# Patient Record
Sex: Male | Born: 1960 | Race: White | Hispanic: No | Marital: Married | Smoking: Never smoker
Health system: Southern US, Community
[De-identification: ages and names within clinical notes are randomized; demographics above are authoritative.]

## PROBLEM LIST (undated history)

## (undated) DIAGNOSIS — N200 Calculus of kidney: Secondary | ICD-10-CM

## (undated) HISTORY — PX: LITHOTRIPSY: SUR834

## (undated) HISTORY — PX: TONSILLECTOMY: SUR1361

---

## 2012-02-12 ENCOUNTER — Emergency Department (HOSPITAL_BASED_OUTPATIENT_CLINIC_OR_DEPARTMENT_OTHER)
Admission: EM | Admit: 2012-02-12 | Discharge: 2012-02-12 | Disposition: A | Discharge status: Home / Self Care | Payer: BC Managed Care – PPO | Attending: Emergency Medicine | Admitting: Emergency Medicine

## 2012-02-12 ENCOUNTER — Encounter (HOSPITAL_BASED_OUTPATIENT_CLINIC_OR_DEPARTMENT_OTHER): Payer: Self-pay | Admitting: *Deleted

## 2012-02-12 ENCOUNTER — Emergency Department (HOSPITAL_BASED_OUTPATIENT_CLINIC_OR_DEPARTMENT_OTHER): Payer: BC Managed Care – PPO

## 2012-02-12 DIAGNOSIS — Z87442 Personal history of urinary calculi: Secondary | ICD-10-CM | POA: Insufficient documentation

## 2012-02-12 DIAGNOSIS — N23 Unspecified renal colic: Secondary | ICD-10-CM

## 2012-02-12 HISTORY — DX: Calculus of kidney: N20.0

## 2012-02-12 LAB — URINE MICROSCOPIC-ADD ON

## 2012-02-12 LAB — URINALYSIS, ROUTINE W REFLEX MICROSCOPIC
Bilirubin Urine: NEGATIVE
Specific Gravity, Urine: 1.031 — ABNORMAL HIGH (ref 1.005–1.030)
Urobilinogen, UA: 0.2 mg/dL (ref 0.0–1.0)

## 2012-02-12 MED ORDER — ONDANSETRON HCL 4 MG/2ML IJ SOLN
4.0000 mg | Freq: Once | INTRAMUSCULAR | Status: AC
  Administered 2012-02-12: 4 mg via INTRAVENOUS
  Filled 2012-02-12: qty 2

## 2012-02-12 MED ORDER — SODIUM CHLORIDE 0.9 % IV BOLUS (SEPSIS)
500.0000 mL | Freq: Once | INTRAVENOUS | Status: AC
  Administered 2012-02-12: 1000 mL via INTRAVENOUS

## 2012-02-12 MED ORDER — OXYCODONE-ACETAMINOPHEN 5-325 MG PO TABS: 1.0000 | ORAL_TABLET | ORAL | Status: DC | PRN

## 2012-02-12 MED ORDER — ONDANSETRON 8 MG PO TBDP: 8.0000 mg | ORAL_TABLET | Freq: Three times a day (TID) | ORAL | Status: AC | PRN

## 2012-02-12 MED ORDER — HYDROMORPHONE HCL PF 1 MG/ML IJ SOLN
1.0000 mg | Freq: Once | INTRAMUSCULAR | Status: AC
  Administered 2012-02-12: 1 mg via INTRAVENOUS
  Filled 2012-02-12: qty 1

## 2012-02-12 MED ORDER — KETOROLAC TROMETHAMINE 30 MG/ML IJ SOLN
30.0000 mg | Freq: Once | INTRAMUSCULAR | Status: AC
  Administered 2012-02-12: 30 mg via INTRAVENOUS
  Filled 2012-02-12: qty 1

## 2012-02-12 NOTE — ED Notes (Signed)
Patient C/o L side pain, hx kidney stones, pain since 530am. Took oxycodone at 700 & 740

## 2012-02-12 NOTE — ED Provider Notes (Signed)
History     CSN: [id number]  Arrival date & time 02/12/12  0844   First MD Initiated Contact with Patient 02/12/12 [phone]      Chief Complaint  Patient presents with  . Flank Pain    (Consider location/radiation/quality/duration/timing/severity/associated sxs/prior treatment) HPI Comments: Symptoms cw prior kidney stones.  Patient passed two prior stones and sees urologist in [facility]. He has had difficulty voiding.  No vomiting, fever, chills.   Patient is a 52 y.o. male presenting with flank pain. The history is provided by the patient.  Flank Pain This is a new problem. The current episode started 1 to 2 hours ago. The problem occurs constantly. The problem has been gradually worsening. Pertinent negatives include no chest pain, no abdominal pain, no headaches and no shortness of breath. Nothing aggravates the symptoms. Nothing relieves the symptoms. Treatments tried: percocet without relief. The treatment provided no relief.    Past Medical History  Diagnosis Date  . Kidney stones     Past Surgical History  Procedure Date  . Tonsillectomy     No family history on file.  History  Substance Use Topics  . Smoking status: Never Smoker   . Smokeless tobacco: Not on file  . Alcohol Use: Yes      Review of Systems  Respiratory: Negative for shortness of breath.   Cardiovascular: Negative for chest pain.  Gastrointestinal: Negative for abdominal pain.  Genitourinary: Positive for flank pain.  Neurological: Negative for headaches.  All other systems reviewed and are negative.    Allergies  Sulfa antibiotics  Home Medications   Current Outpatient Rx  Name  Route  Sig  Dispense  Refill  . ONDANSETRON 8 MG PO TBDP   Oral   Take 1 tablet (8 mg total) by mouth every 8 (eight) hours as needed for nausea.   20 tablet   0   . OXYCODONE-ACETAMINOPHEN 5-325 MG PO TABS   Oral   Take 1 tablet by mouth every 4 (four) hours as needed for pain.   15 tablet   0      BP 129/73  Pulse 60  Resp 19  SpO2 100%  Physical Exam  Nursing note and vitals reviewed. Constitutional: He is oriented to person, place, and time. He appears well-developed and well-nourished.       Pacing and appears uncomfortable  HENT:  Head: Normocephalic and atraumatic.  Right Ear: External ear normal.  Left Ear: External ear normal.  Nose: Nose normal.  Mouth/Throat: Oropharynx is clear and moist.  Eyes: Conjunctivae normal and EOM are normal. Pupils are equal, round, and reactive to light.  Neck: Normal range of motion. Neck supple.  Cardiovascular: Normal rate, regular rhythm, normal heart sounds and intact distal pulses.   Pulmonary/Chest: Effort normal and breath sounds normal. No respiratory distress. He has no wheezes. He exhibits no tenderness.  Abdominal: Soft. Bowel sounds are normal. He exhibits no distension and no mass. There is no tenderness. There is no rebound and no guarding.  Musculoskeletal: Normal range of motion.  Neurological: He is alert and oriented to person, place, and time. He has normal reflexes. He exhibits normal muscle tone. Coordination normal.  Skin: Skin is warm and dry.  Psychiatric: He has a normal mood and affect. His behavior is normal. Judgment and thought content normal.    ED Course  Procedures (including critical care time)  Labs Reviewed  URINALYSIS, ROUTINE W REFLEX MICROSCOPIC - Abnormal; Notable for the following:  Color, Urine AMBER (*)  BIOCHEMICALS MAY BE AFFECTED BY COLOR   Specific Gravity, Urine 1.031 (*)     Hgb urine dipstick MODERATE (*)     Ketones, ur 15 (*)     All other components within normal limits  URINE MICROSCOPIC-ADD ON   Ct Abdomen Pelvis Wo Contrast  02/12/2012  *RADIOLOGY REPORT*  Clinical Data: Acute onset of left flank pain earlier this morning. History of urinary tract calculi.  CT ABDOMEN AND PELVIS WITHOUT CONTRAST  Technique:  Multidetector CT imaging of the abdomen and pelvis was  performed following the standard protocol without intravenous contrast.  Comparison: None.  Findings: Approximate 3 mm calculus at the left ureterovesical junction causing moderate left hydronephrosis, left renal edema, and left perinephric edema.  Tiny (1-2 mm) calculi in adjacent upper pole calyces of the left kidney.  Non-obstructing approximate 4 mm calculus in a mid calix of the right kidney.  Within the limits of the unenhanced technique, no focal parenchymal abnormality involving either kidney.  Normal unenhanced appearance of the liver, spleen, pancreas, adrenal glands, and gallbladder.  Accessory splenic tissue medial to the spleen anteriorly at the hilum.  No biliary ductal dilation. Mild iliofemoral atherosclerosis.  No significant lymphadenopathy.  Small hiatal hernia.  Stomach otherwise normal in appearance. Inspissated stool-like material over a long segment of the distal and terminal ileum.  Normal-appearing colon.  Normal appendix in the right mid pelvis.  No ascites.  Urinary bladder decompressed.  Prostate gland and seminal vesicles normal for age.  Phlebolith low in the right side of the pelvis.  Bone window images demonstrate degenerative disc disease and spondylosis at L5-S1.  Visualized lung bases clear.  Heart size normal.  IMPRESSION:  1.  Obstructing approximate 3 mm calculus at the left UVJ. 2.  Non-obstructing tiny (1-2 mm) calculi in upper pole calyces of the left kidney and approximate 4 mm calculus in a mid calix of the right kidney. 3.  Small hiatal hernia. 4.  Inspissated stool-like material over a long segment of the distal and terminal ileum consistent with stasis. 5.  Mild iliofemoral atherosclerosis, somewhat advanced for age.   Original Report Authenticated By: Hulan Saas, M.D.      1. Ureteral colic       MDM  Pain improved with meds and feels better.        Hilario Quarry, MD 02/12/12 1009

## 2012-03-24 ENCOUNTER — Encounter (HOSPITAL_BASED_OUTPATIENT_CLINIC_OR_DEPARTMENT_OTHER): Payer: Self-pay | Admitting: *Deleted

## 2012-03-24 ENCOUNTER — Emergency Department (HOSPITAL_BASED_OUTPATIENT_CLINIC_OR_DEPARTMENT_OTHER)
Admission: EM | Admit: 2012-03-24 | Discharge: 2012-03-24 | Disposition: A | Discharge status: Home / Self Care | Payer: BC Managed Care – PPO | Attending: Emergency Medicine | Admitting: Emergency Medicine

## 2012-03-24 DIAGNOSIS — R112 Nausea with vomiting, unspecified: Secondary | ICD-10-CM | POA: Insufficient documentation

## 2012-03-24 DIAGNOSIS — Z87448 Personal history of other diseases of urinary system: Secondary | ICD-10-CM | POA: Insufficient documentation

## 2012-03-24 DIAGNOSIS — N23 Unspecified renal colic: Secondary | ICD-10-CM

## 2012-03-24 DIAGNOSIS — Z87442 Personal history of urinary calculi: Secondary | ICD-10-CM | POA: Insufficient documentation

## 2012-03-24 DIAGNOSIS — N201 Calculus of ureter: Secondary | ICD-10-CM | POA: Insufficient documentation

## 2012-03-24 DIAGNOSIS — Z79899 Other long term (current) drug therapy: Secondary | ICD-10-CM | POA: Insufficient documentation

## 2012-03-24 MED ORDER — KETOROLAC TROMETHAMINE 15 MG/ML IJ SOLN
INTRAMUSCULAR | Status: AC
  Administered 2012-03-24: 15 mg via INTRAVENOUS
  Filled 2012-03-24: qty 1

## 2012-03-24 MED ORDER — HYDROMORPHONE HCL 4 MG PO TABS: 2.0000 mg | ORAL_TABLET | ORAL | Status: AC | PRN

## 2012-03-24 MED ORDER — METOCLOPRAMIDE HCL 5 MG/ML IJ SOLN
INTRAMUSCULAR | Status: AC
  Administered 2012-03-24: 10 mg via INTRAMUSCULAR
  Filled 2012-03-24: qty 2

## 2012-03-24 MED ORDER — SODIUM CHLORIDE 0.9 % IV SOLN
INTRAVENOUS | Status: DC
  Administered 2012-03-24 (×2): via INTRAVENOUS

## 2012-03-24 MED ORDER — PROMETHAZINE HCL 25 MG PO TABS: 25.0000 mg | ORAL_TABLET | Freq: Four times a day (QID) | ORAL | Status: AC | PRN

## 2012-03-24 MED ORDER — HYDROMORPHONE HCL PF 1 MG/ML IJ SOLN
1.0000 mg | Freq: Once | INTRAMUSCULAR | Status: AC
  Administered 2012-03-24: 1 mg via INTRAVENOUS

## 2012-03-24 MED ORDER — TAMSULOSIN HCL 0.4 MG PO CAPS: 0.4000 mg | ORAL_CAPSULE | Freq: Once | ORAL | Status: DC

## 2012-03-24 MED ORDER — METOCLOPRAMIDE HCL 5 MG/ML IJ SOLN
10.0000 mg | Freq: Once | INTRAMUSCULAR | Status: AC
  Administered 2012-03-24: 10 mg via INTRAMUSCULAR

## 2012-03-24 MED ORDER — HYDROMORPHONE HCL PF 1 MG/ML IJ SOLN
INTRAMUSCULAR | Status: AC
  Administered 2012-03-24: 1 mg via INTRAVENOUS
  Filled 2012-03-24: qty 1

## 2012-03-24 MED ORDER — KETOROLAC TROMETHAMINE 15 MG/ML IJ SOLN
15.0000 mg | Freq: Once | INTRAMUSCULAR | Status: AC
  Administered 2012-03-24: 15 mg via INTRAVENOUS

## 2012-03-24 MED ORDER — HYDROMORPHONE HCL PF 1 MG/ML IJ SOLN
1.0000 mg | Freq: Once | INTRAMUSCULAR | Status: AC
  Administered 2012-03-24: 1 mg via INTRAVENOUS
  Filled 2012-03-24: qty 1

## 2012-03-24 NOTE — ED Notes (Signed)
Pt wife requesting the patient have ice chips on arrival to triage room, told her we would have to hold off until we speak with EDP. EDP at bedside, okay for patient to have ice chips. Gave patient ice chips

## 2012-03-24 NOTE — ED Notes (Signed)
MD at bedside to speak with patient and family.  Pt resting comfortably.

## 2012-03-24 NOTE — ED Provider Notes (Signed)
History     CSN: [id number]  Arrival date & time 03/24/12  0011   First MD Initiated Contact with Patient 03/24/12 0021      Chief Complaint  Patient presents with  . Flank Pain    (Consider location/radiation/quality/duration/timing/severity/associated sxs/prior treatment) HPI Level V caveat: Severe pain. This is a 52 year old male who had lithotripsy yesterday morning for known 4 mm stone. He has been passing gravel throughout the day yesterday. Since about 9 PM yesterday evening he has been unable to urinate and has had severe pain in his right flank radiating to his right groin. The pain is only slightly worsened with movement or palpation. There is associated nausea and vomiting. The pain is characterized as like prior kidney stones. He had been having gross hematuria throughout most of the day but this is resolved.  Past Medical History  Diagnosis Date  . Kidney stones   . Kidney stones     Past Surgical History  Procedure Laterality Date  . Tonsillectomy    . Lithotripsy      No family history on file.  History  Substance Use Topics  . Smoking status: Never Smoker   . Smokeless tobacco: Not on file  . Alcohol Use: No      Review of Systems  Unable to perform ROS   Allergies  Sulfa antibiotics  Home Medications   Current Outpatient Rx  Name  Route  Sig  Dispense  Refill  . omeprazole (PRILOSEC) 40 MG capsule   Oral   Take 40 mg by mouth daily.         . ondansetron (ZOFRAN ODT) 8 MG disintegrating tablet   Oral   Take 1 tablet (8 mg total) by mouth every 8 (eight) hours as needed for nausea.   20 tablet   0   . oxyCODONE-acetaminophen (PERCOCET/ROXICET) 5-325 MG per tablet   Oral   Take 1 tablet by mouth every 4 (four) hours as needed for pain.   15 tablet   0     BP 124/76  Pulse 60  Temp(Src) 97.6 F (36.4 C) (Oral)  Resp 20  Ht 5\' 10"  (1.778 m)  Wt 220 lb (99.791 kg)  BMI 31.57 kg/m2  SpO2 100%  Physical Exam General:  Well-developed, well-nourished male in obvious pain; appearance consistent with age of record HENT: normocephalic, atraumatic; dry mucous membranes Eyes: pupils equal round and reactive to light; extraocular muscles intact Neck: supple Heart: regular rate and rhythm Lungs: clear to auscultation bilaterally Abdomen: soft; nondistended; nontender; bowel sounds present GU: Mild right CVA tenderness; urine clear, yellow Extremities: No deformity; full range of motion Neurologic: Awake, alert and oriented; motor function intact in all extremities and symmetric; no facial droop Skin: Mildly clammy Psychiatric: Agitated; anxious    ED Course  Procedures (including critical care time)    MDM  1:47 AM Pain well controlled after IV medications. We will switch from oxycodone to Dilaudid tablets.        Hanley Seamen, MD 03/24/12 [phone]

## 2012-03-24 NOTE — ED Notes (Signed)
Pt reports he had a lithotripsy completed today and has been passing stones today. C/o right abdominal pain and flank pain and feeling like he cannot urinate. ([facility] completed the procedure)

## 2020-10-14 ENCOUNTER — Encounter (HOSPITAL_BASED_OUTPATIENT_CLINIC_OR_DEPARTMENT_OTHER): Payer: Self-pay | Admitting: *Deleted

## 2020-10-14 ENCOUNTER — Emergency Department (HOSPITAL_BASED_OUTPATIENT_CLINIC_OR_DEPARTMENT_OTHER): Payer: BC Managed Care – PPO

## 2020-10-14 ENCOUNTER — Emergency Department (HOSPITAL_BASED_OUTPATIENT_CLINIC_OR_DEPARTMENT_OTHER)
Admission: EM | Admit: 2020-10-14 | Discharge: 2020-10-14 | Disposition: A | Discharge status: Home / Self Care | Payer: BC Managed Care – PPO | Attending: Student | Admitting: Student

## 2020-10-14 ENCOUNTER — Other Ambulatory Visit: Payer: Self-pay

## 2020-10-14 DIAGNOSIS — R1031 Right lower quadrant pain: Secondary | ICD-10-CM | POA: Diagnosis not present

## 2020-10-14 DIAGNOSIS — N200 Calculus of kidney: Secondary | ICD-10-CM

## 2020-10-14 LAB — URINALYSIS, ROUTINE W REFLEX MICROSCOPIC
Bilirubin Urine: NEGATIVE
Glucose, UA: NEGATIVE mg/dL
Ketones, ur: 5 mg/dL — AB
Leukocytes,Ua: NEGATIVE
Nitrite: NEGATIVE
Protein, ur: NEGATIVE mg/dL
Specific Gravity, Urine: 1.03 (ref 1.005–1.030)
pH: 6 (ref 5.0–8.0)

## 2020-10-14 LAB — URINALYSIS, MICROSCOPIC (REFLEX): WBC, UA: NONE SEEN WBC/hpf (ref 0–5)

## 2020-10-14 LAB — CBC
HCT: 41.4 % (ref 39.0–52.0)
Hemoglobin: 13.8 g/dL (ref 13.0–17.0)
MCH: 28.4 pg (ref 26.0–34.0)
MCHC: 33.3 g/dL (ref 30.0–36.0)
MCV: 85.2 fL (ref 80.0–100.0)
Platelets: 359 10*3/uL (ref 150–400)
RBC: 4.86 MIL/uL (ref 4.22–5.81)
RDW: 12.5 % (ref 11.5–15.5)
WBC: 8.5 10*3/uL (ref 4.0–10.5)
nRBC: 0 % (ref 0.0–0.2)

## 2020-10-14 LAB — COMPREHENSIVE METABOLIC PANEL
ALT: 26 U/L (ref 0–44)
AST: 27 U/L (ref 15–41)
Albumin: 4.3 g/dL (ref 3.5–5.0)
Alkaline Phosphatase: 80 U/L (ref 38–126)
Anion gap: 7 (ref 5–15)
BUN: 20 mg/dL (ref 6–20)
CO2: 26 mmol/L (ref 22–32)
Calcium: 8.9 mg/dL (ref 8.9–10.3)
Chloride: 105 mmol/L (ref 98–111)
Creatinine, Ser: 2.04 mg/dL — ABNORMAL HIGH (ref 0.61–1.24)
GFR, Estimated: 37 mL/min — ABNORMAL LOW (ref >60)
Glucose, Bld: 123 mg/dL — ABNORMAL HIGH (ref 70–99)
Potassium: 4.6 mmol/L (ref 3.5–5.1)
Sodium: 138 mmol/L (ref 135–145)
Total Bilirubin: 0.7 mg/dL (ref 0.3–1.2)
Total Protein: 6.9 g/dL (ref 6.5–8.1)

## 2020-10-14 LAB — LIPASE, BLOOD: Lipase: 32 U/L (ref 11–51)

## 2020-10-14 MED ORDER — ONDANSETRON HCL 4 MG/2ML IJ SOLN
4.0000 mg | Freq: Once | INTRAMUSCULAR | Status: AC
  Administered 2020-10-14: 4 mg via INTRAVENOUS
  Filled 2020-10-14: qty 2

## 2020-10-14 MED ORDER — MORPHINE SULFATE (PF) 4 MG/ML IV SOLN
6.0000 mg | Freq: Once | INTRAVENOUS | Status: AC
  Administered 2020-10-14: 6 mg via INTRAVENOUS
  Filled 2020-10-14: qty 2

## 2020-10-14 MED ORDER — IOHEXOL 350 MG/ML SOLN
85.0000 mL | Freq: Once | INTRAVENOUS | Status: AC | PRN
  Administered 2020-10-14: 85 mL via INTRAVENOUS

## 2020-10-14 MED ORDER — LACTATED RINGERS IV BOLUS: 1000.0000 mL | Freq: Once | INTRAVENOUS | Status: DC

## 2020-10-14 NOTE — Discharge Instructions (Addendum)
You were seen in the emergency department for evaluation of abdominal pain.  Your CAT scan today shows evidence that you recently passed a kidney stone and your pain was likely due to a kidney stone.  Labs were performed today that showed that your creatinine has doubled to a value of 2 today.  This needs to be followed up next week in clinic to ensure that it is trending downward and that your kidney function is recovering.  Return the emergency department if you have new or worsening abdominal pain, vomiting, fevers or any other concerning symptoms.  Please follow-up with the urologist.

## 2020-10-14 NOTE — ED Provider Notes (Signed)
[facility] EMERGENCY DEPARTMENT Provider Note   CSN: [id number] Arrival date & time: 10/14/20  2123     History Chief Complaint  Patient presents with   Abdominal Pain    Kelly Bridges is a 60 y.o. male with PMH nephrolithiasis who presents the emergency department for evaluation of right lower quadrant pain.  Patient states that his pain began approximately 1 hour prior to arrival and does not feel like his normal kidney stones.  He states his nephrolithiasis pain usually starts in the back and radiates around to the front, but this is isolated to the right lower quadrant and he felt like something "ruptured".  Denies chest pain, shortness of breath, abdominal pain, nausea, vomiting, dysuria or increased frequency or any other systemic symptoms.  Patient took Flomax prior to arrival today.   Abdominal Pain Associated symptoms: no chest pain, no chills, no cough, no dysuria, no fever, no hematuria, no shortness of breath, no sore throat and no vomiting       Past Medical History:  Diagnosis Date   Kidney stones     There are no problems to display for this patient.   Past Surgical History:  Procedure Laterality Date   LITHOTRIPSY     TONSILLECTOMY         No family history on file.  Social History   Tobacco Use   Smoking status: Never  Substance Use Topics   Alcohol use: No   Drug use: No    Home Medications Prior to Admission medications   Medication Sig Start Date End Date Taking? Authorizing Provider  HYDROmorphone (DILAUDID) 4 MG tablet Take 0.5-1 tablets (2-4 mg total) by mouth every 4 (four) hours as needed for pain. 03/24/12   Molpus, John, MD  omeprazole (PRILOSEC) 40 MG capsule Take 40 mg by mouth daily.    [provider]  ondansetron (ZOFRAN ODT) 8 MG disintegrating tablet Take 1 tablet (8 mg total) by mouth every 8 (eight) hours as needed for nausea. 02/12/12   Margarita Grizzle, MD  promethazine (PHENERGAN) 25 MG tablet Take 1 tablet  (25 mg total) by mouth every 6 (six) hours as needed for nausea. 03/24/12   Molpus, John, MD    Allergies    Sulfa antibiotics  Review of Systems   Review of Systems  Constitutional:  Negative for chills and fever.  HENT:  Negative for ear pain and sore throat.   Eyes:  Negative for pain and visual disturbance.  Respiratory:  Negative for cough and shortness of breath.   Cardiovascular:  Negative for chest pain and palpitations.  Gastrointestinal:  Positive for abdominal pain. Negative for vomiting.  Genitourinary:  Negative for dysuria and hematuria.  Musculoskeletal:  Negative for arthralgias and back pain.  Skin:  Negative for color change and rash.  Neurological:  Negative for seizures and syncope.  All other systems reviewed and are negative.  Physical Exam Updated Vital Signs BP (!) 163/89   Pulse (!) 59   Temp 98.2 F (36.8 C) (Oral)   Resp 16   Ht 5\' 10"  (1.778 m)   Wt 104.3 kg   SpO2 98%   BMI 33.00 kg/m   Physical Exam Vitals and nursing note reviewed.  Constitutional:      Appearance: He is well-developed.  HENT:     Head: Normocephalic and atraumatic.  Eyes:     Conjunctiva/sclera: Conjunctivae normal.  Cardiovascular:     Rate and Rhythm: Normal rate and regular rhythm.  Heart sounds: No murmur heard. Pulmonary:     Effort: Pulmonary effort is normal. No respiratory distress.     Breath sounds: Normal breath sounds.  Abdominal:     Palpations: Abdomen is soft.     Tenderness: There is abdominal tenderness in the right lower quadrant.  Musculoskeletal:     Cervical back: Neck supple.  Skin:    General: Skin is warm and dry.  Neurological:     Mental Status: He is alert.    ED Results / Procedures / Treatments   Labs (all labs ordered are listed, but only abnormal results are displayed) Labs Reviewed  COMPREHENSIVE METABOLIC PANEL - Abnormal; Notable for the following components:      Result Value   Glucose, Bld 123 (*)    Creatinine, Ser  2.04 (*)    GFR, Estimated 37 (*)    All other components within normal limits  URINALYSIS, ROUTINE W REFLEX MICROSCOPIC - Abnormal; Notable for the following components:   Hgb urine dipstick MODERATE (*)    Ketones, ur 5 (*)    All other components within normal limits  URINALYSIS, MICROSCOPIC (REFLEX) - Abnormal; Notable for the following components:   Bacteria, UA RARE (*)    All other components within normal limits  LIPASE, BLOOD  CBC    EKG None  Radiology No results found.  Procedures Procedures   Medications Ordered in ED Medications  iohexol (OMNIPAQUE) 350 MG/ML injection 85 mL (has no administration in time range)  morphine 4 MG/ML injection 6 mg (6 mg Intravenous Given 10/14/20 2206)  ondansetron (ZOFRAN) injection 4 mg (4 mg Intravenous Given 10/14/20 2206)    ED Course  I have reviewed the triage vital signs and the nursing notes.  Pertinent labs & imaging results that were available during my care of the patient were reviewed by me and considered in my medical decision making (see chart for details).    MDM Rules/Calculators/A&P                           Seen the emergency department for evaluation of right lower quadrant pain.  Physical exam reveals tenderness of the right lower quadrant but is otherwise unremarkable.  CBC with no leukocytosis, chemistry with an apparent creatinine elevation to 2.04 when compared to patient's outpatient labs with last creatinine 0.94 at [facility].  Patient is GFR is 37 and with recent evidence questioning the validity of contrast-induced nephropathy for patients with GFR is greater than 30, a contrasted CT was performed to rule out intra-abdominal infection in the right lower quadrant including appendicitis.  CT reassuringly negative for intra-abdominal infection and does show a 3 mm stone in the bladder that was likely passed while he was in the emergency department today.  Urinalysis negative for infection and patient was  discharged with outpatient urology follow-up for repeat creatinine testing next week. Final Clinical Impression(s) / ED Diagnoses Final diagnoses:  None    Rx / DC Orders ED Discharge Orders     None        Judea Riches, MD 10/14/20 2351

## 2020-10-14 NOTE — ED Triage Notes (Signed)
C/o right lower abd pain x 1 hr ago nausea only

## 2022-10-13 IMAGING — CT CT ABD-PELV W/ CM
2 of 6 series · 16 of 46 positions shown, 18 images · IV contrast (Omnipaque)
Comparison: 02/12/2012

CLINICAL DATA: Right lower quadrant abdominal pain, history of
kidney stones

EXAM:
CT ABDOMEN AND PELVIS WITH CONTRAST
TECHNIQUE: Multidetector CT imaging of the abdomen and pelvis was performed
using the standard protocol following bolus administration of
intravenous contrast.
CONTRAST:  85mL OMNIPAQUE IOHEXOL 350 MG/ML SOLN

[Series 2: axial st · axial · 0.98mm/px · z∈[-840,-406]mm · 13 of 99 slices shown, 15 images]
[im 6/99  soft-tissue]
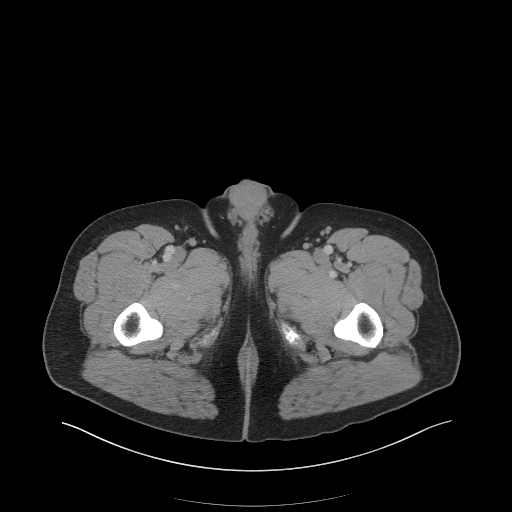
[im 6/99  bone]
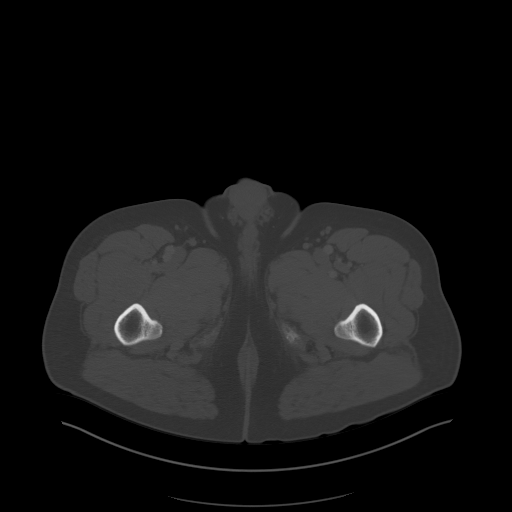
[im 16/99  soft-tissue]
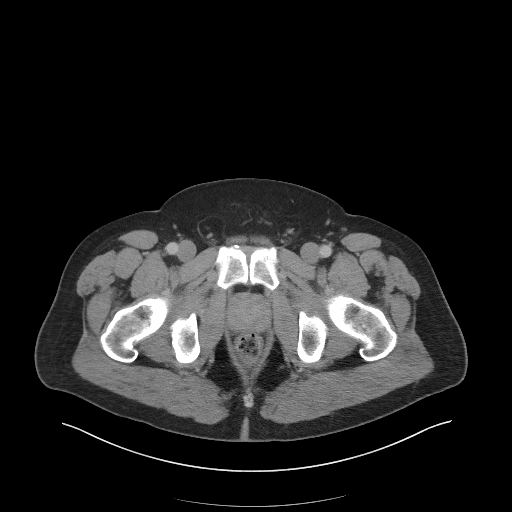
[im 21/99  soft-tissue]
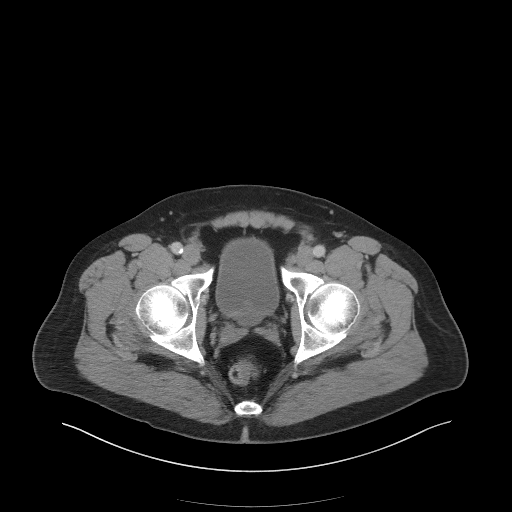
[im 26/99  soft-tissue]
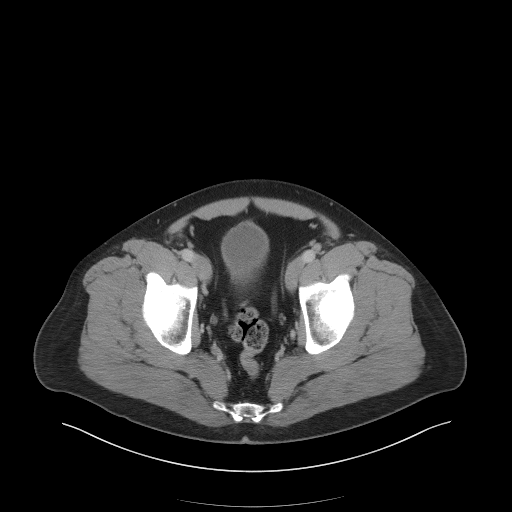
[im 37/99  soft-tissue]
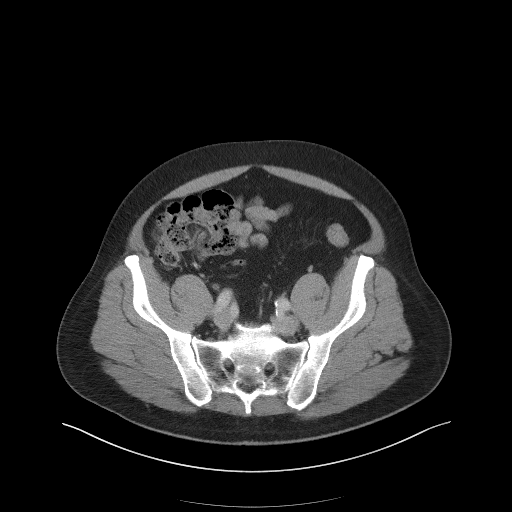
[im 42/99  soft-tissue]
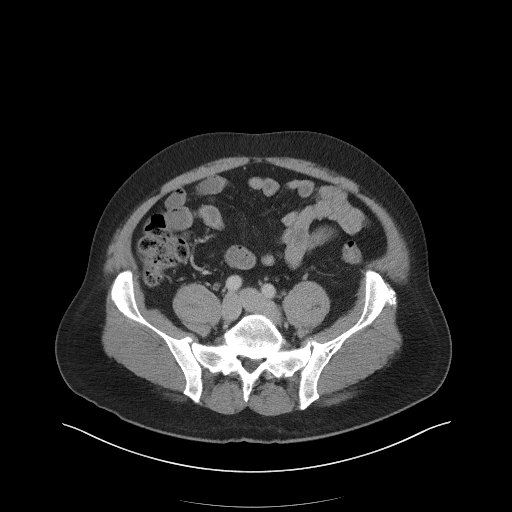
[im 52/99  soft-tissue]
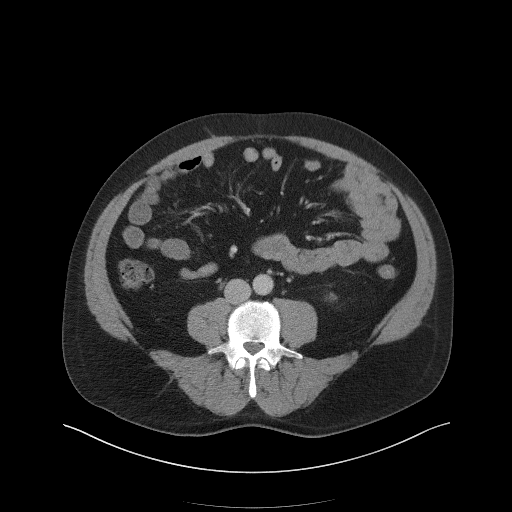
[im 57/99  soft-tissue]
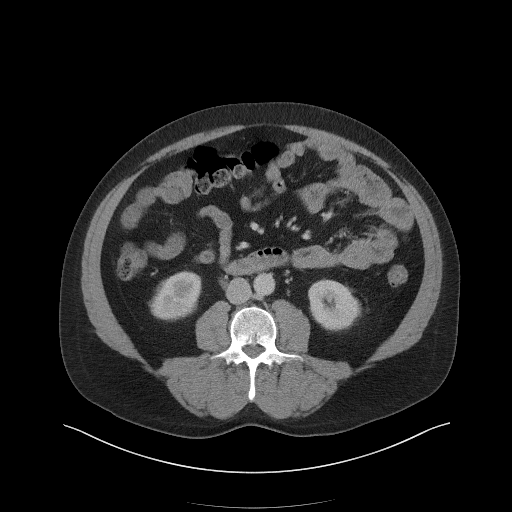
[im 62/99  soft-tissue]
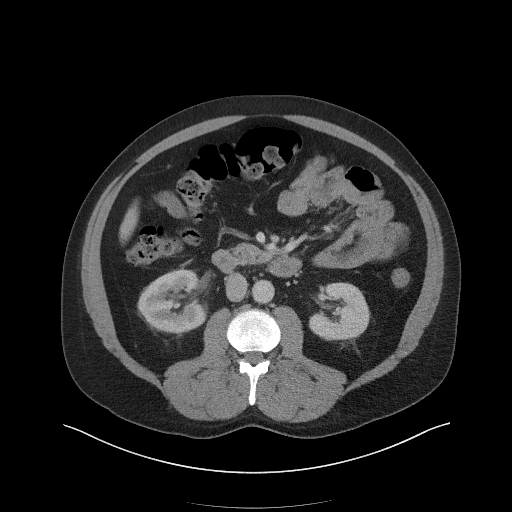
[im 62/99  bone]
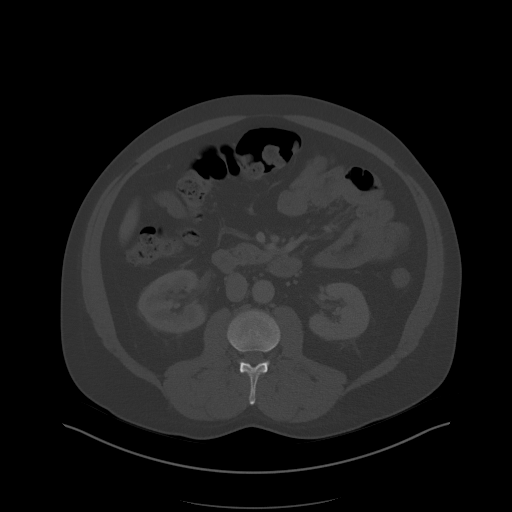
[im 73/99  soft-tissue]
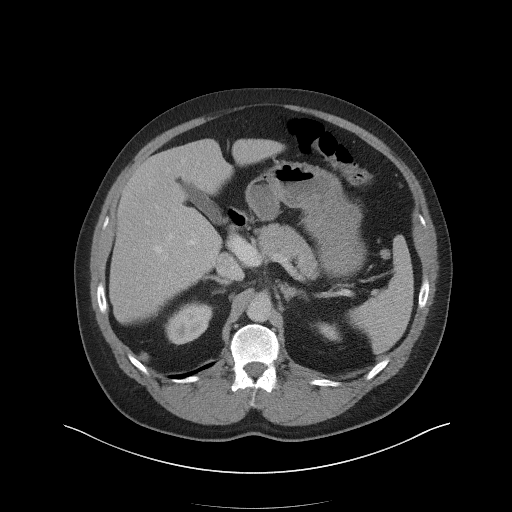
[im 78/99  soft-tissue]
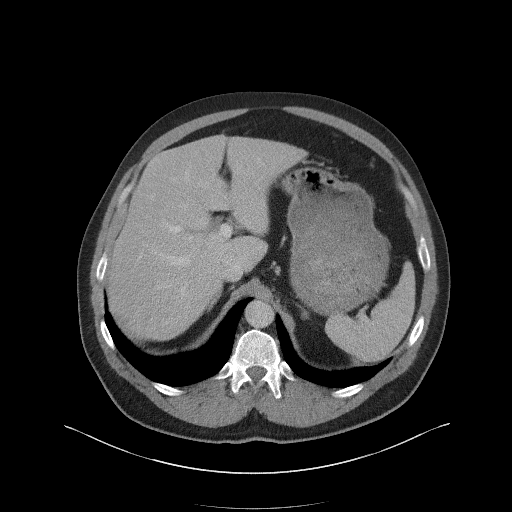
[im 83/99  soft-tissue]
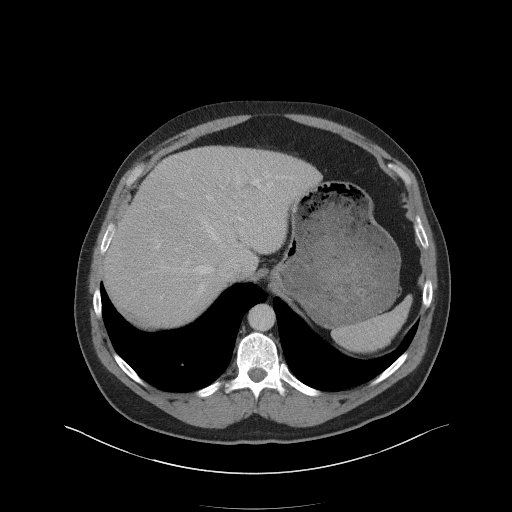
[im 93/99  soft-tissue]
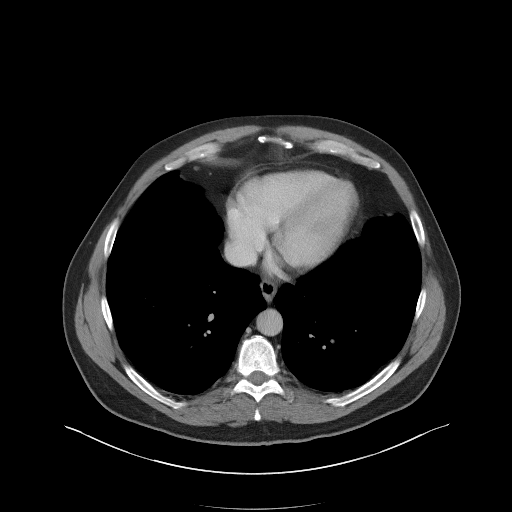

[Series 5: coronal st · coronal · 0.77mm/px · 3 of 111 slices shown]
[im 37/111  soft-tissue]
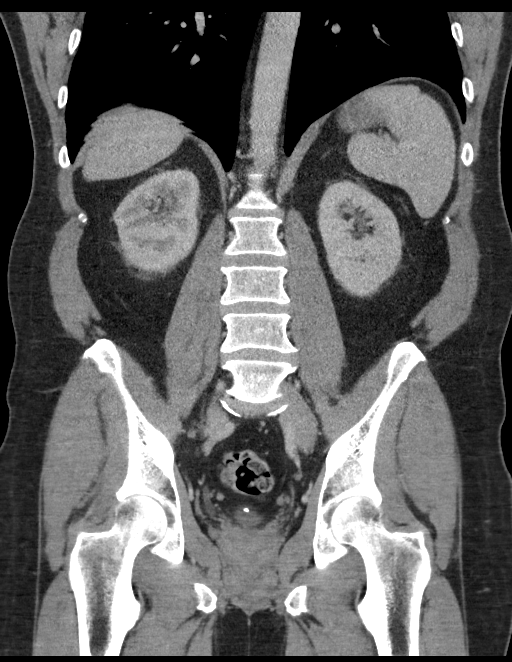
[im 49/111  soft-tissue]
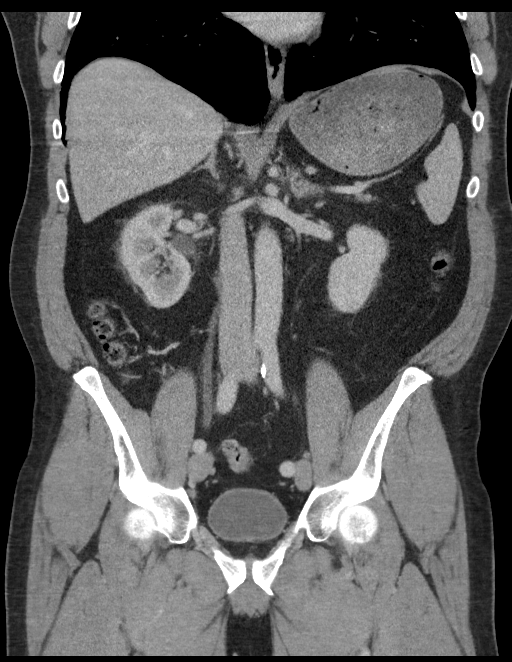
[im 62/111  soft-tissue]
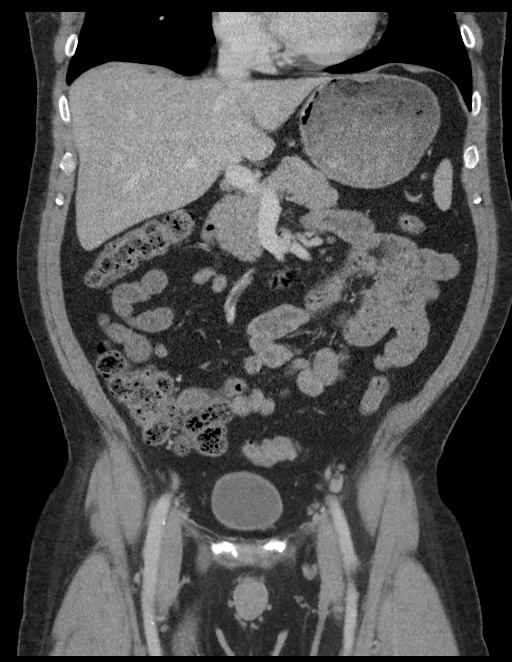

[16 of 46 positions shown; findings below may reference images not displayed]

FINDINGS: Lower chest: Lung bases are clear.

Hepatobiliary: Liver is within normal limits.

Gallbladder is unremarkable. No intrahepatic or extrahepatic ductal
dilatation.

Pancreas: Within normal limits.

Spleen: Within normal limits.

Adrenals/Urinary Tract: Adrenal glands are within normal limits.

5 mm nonobstructing right upper pole renal calculus (series 2/image
33). Two nonobstructing left renal calculi measuring 6 mm in the
upper and lower poles (series 2/images 34 and 42). Subcentimeter
interpolar left renal cyst (series 2/image 37). No hydronephrosis.

Bladder is notable for a 3 mm layering calculus (series 2/image 37).

Stomach/Bowel: Stomach is within normal limits.

No evidence of bowel obstruction.

Normal appendix (series 2/image 63).

No colonic wall thickening or inflammatory changes.

Vascular/Lymphatic: No evidence of abdominal aortic aneurysm.

Atherosclerotic calcifications of the abdominal aorta and branch
vessels.

No suspicious abdominopelvic lymphadenopathy.

Reproductive: Prostate is unremarkable.

Other: No abdominopelvic ascites.

Tiny fat containing right inguinal hernia (series 2/image 84).

Musculoskeletal: Mild degenerative changes at L5-S1.
IMPRESSION: Normal appendix.

3 mm layering bladder calculus. This may reflect recent passage of a
right ureteral calculus given the clinical history.

Bilateral nonobstructing renal calculi, measuring 5-6 mm, as above.
No hydronephrosis.
